# Patient Record
Sex: Male | Born: 1970 | Race: White | Hispanic: No | Marital: Single | Smoking: Current every day smoker
Health system: Southern US, Community
[De-identification: ages and names within clinical notes are randomized; demographics above are authoritative.]

## PROBLEM LIST (undated history)

## (undated) DIAGNOSIS — G43909 Migraine, unspecified, not intractable, without status migrainosus: Secondary | ICD-10-CM

## (undated) HISTORY — DX: Migraine, unspecified, not intractable, without status migrainosus: G43.909

## (undated) HISTORY — PX: FEMUR SURGERY: SHX943

## (undated) HISTORY — PX: HEEL SPUR SURGERY: SHX665

---

## 2000-09-02 DIAGNOSIS — Z8781 Personal history of (healed) traumatic fracture: Secondary | ICD-10-CM

## 2000-09-02 HISTORY — DX: Personal history of (healed) traumatic fracture: Z87.81

## 2011-06-03 ENCOUNTER — Ambulatory Visit: Payer: Self-pay | Admitting: Internal Medicine

## 2012-10-11 IMAGING — CT CT HEAD WITHOUT CONTRAST
1 series · 16 of 30 positions shown, 20 images · non-contrast
Comparison: none

REASON FOR EXAM: headache
COMMENTS:

[Series 2: soft tissue · axial · 0.42mm/px · z∈[+670,+810]mm · 16 of 32 slices shown, 20 images]
[im 2/32  brain]
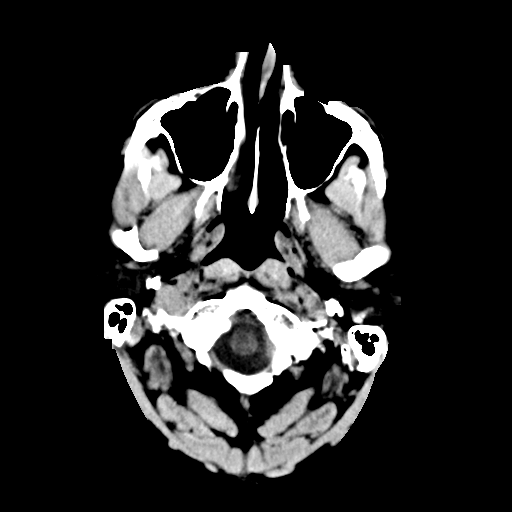
[im 2/32  bone]
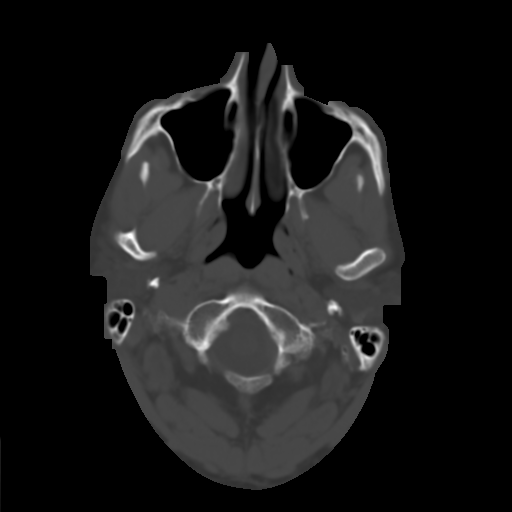
[im 4/32  brain]
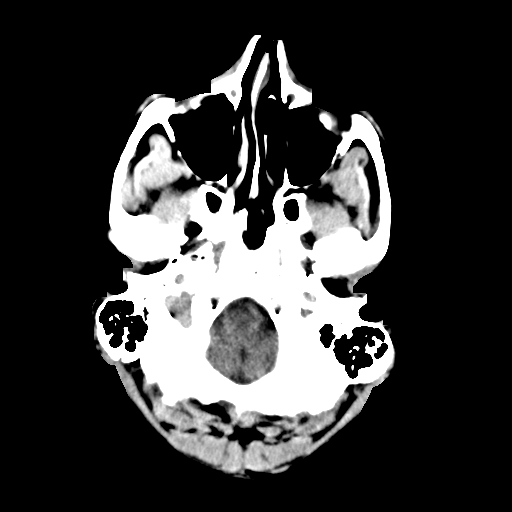
[im 6/32  brain]
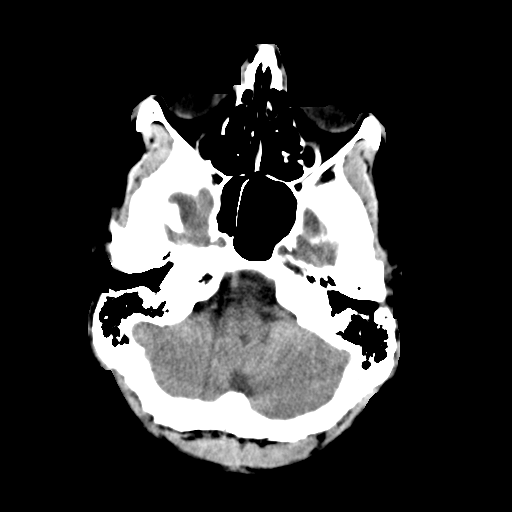
[im 8/32  brain]
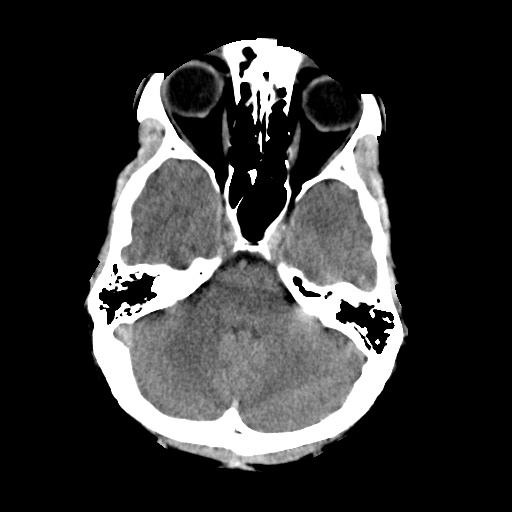
[im 9/32  brain]
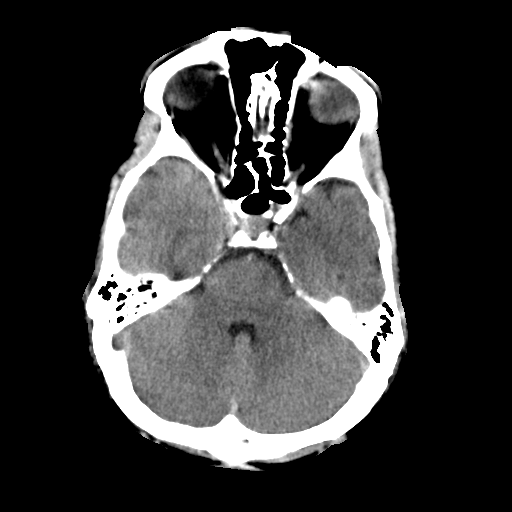
[im 9/32  bone]
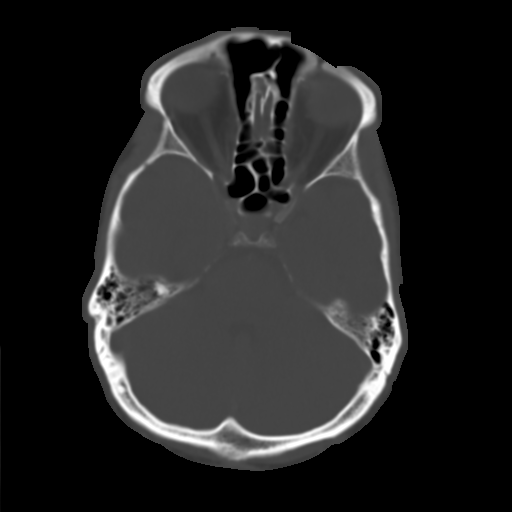
[im 11/32  brain]
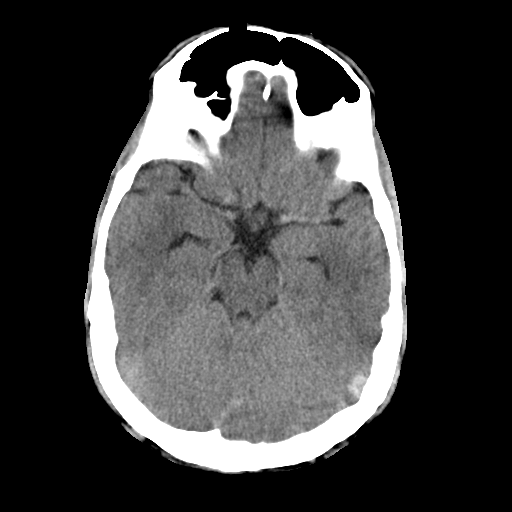
[im 13/32  brain]
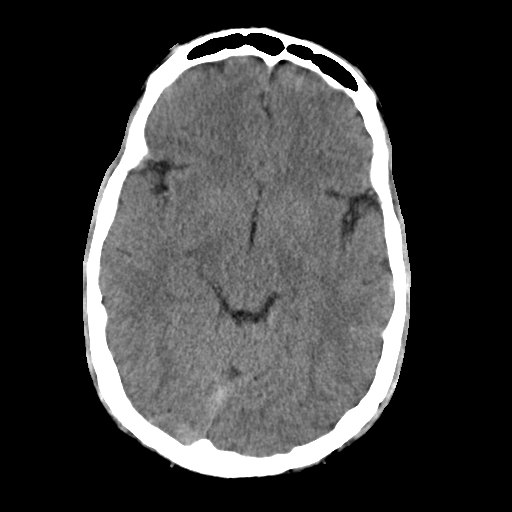
[im 15/32  brain]
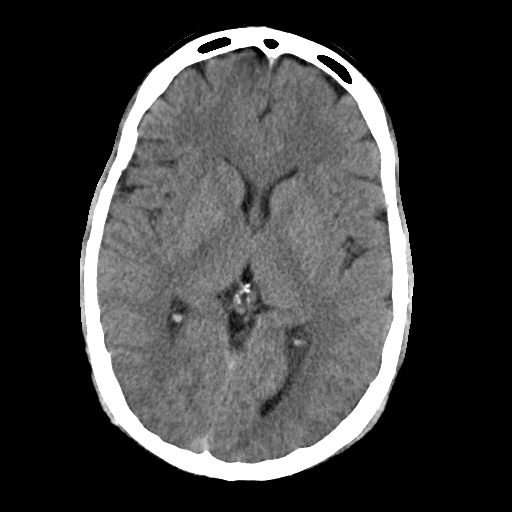
[im 17/32  brain]
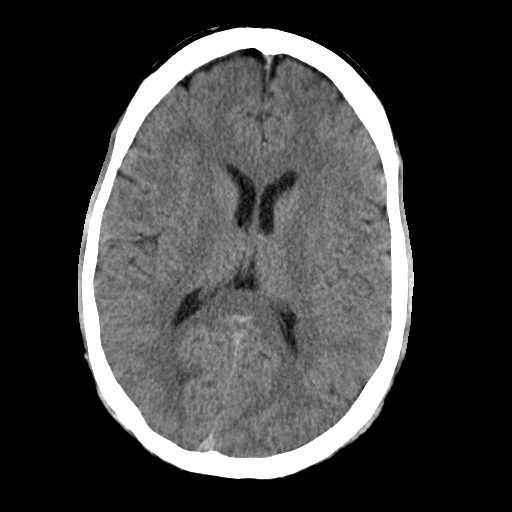
[im 17/32  bone]
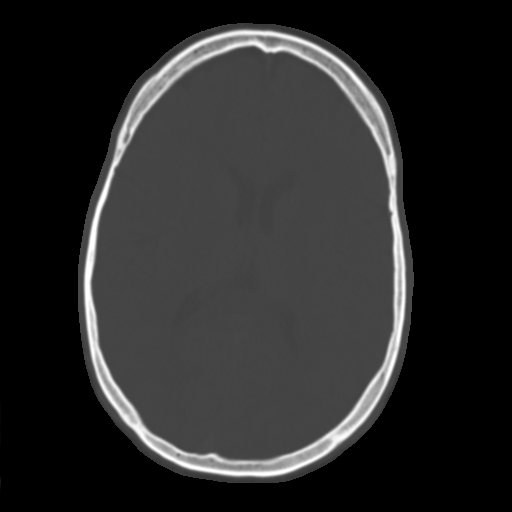
[im 19/32  brain]
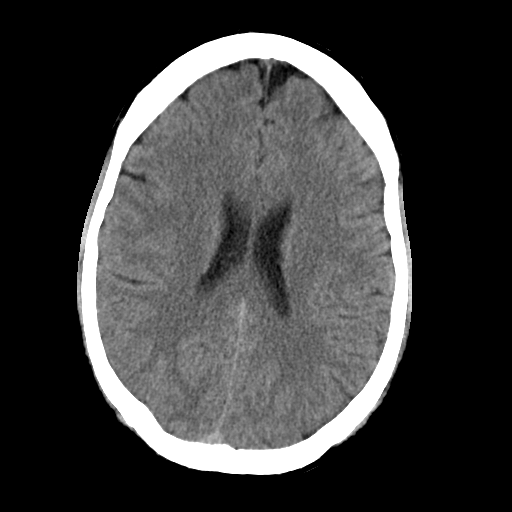
[im 21/32  brain]
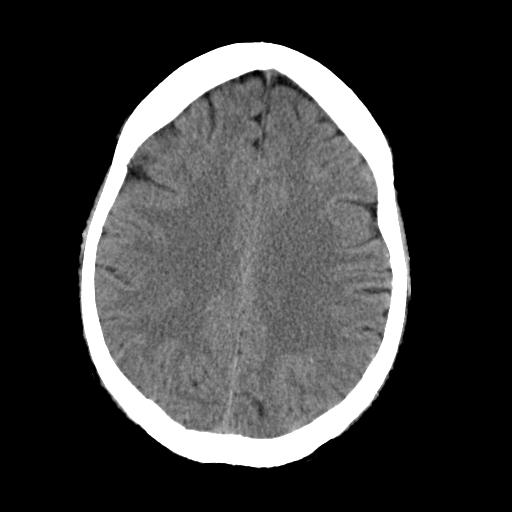
[im 23/32  brain]
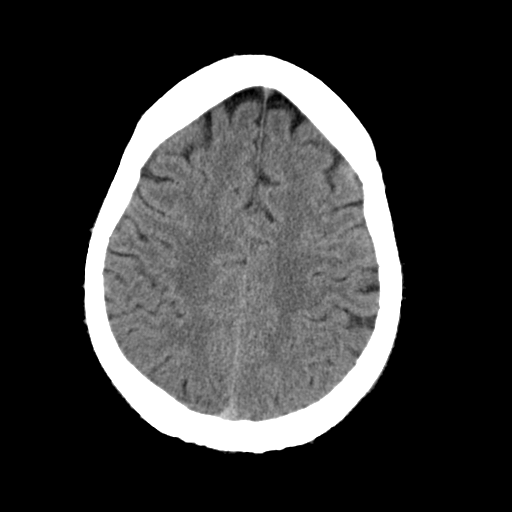
[im 24/32  brain]
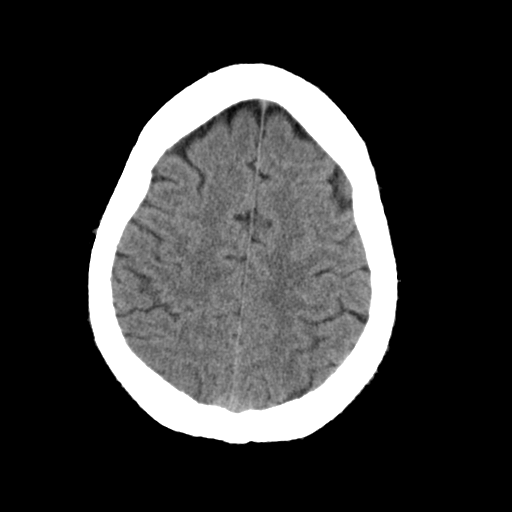
[im 24/32  bone]
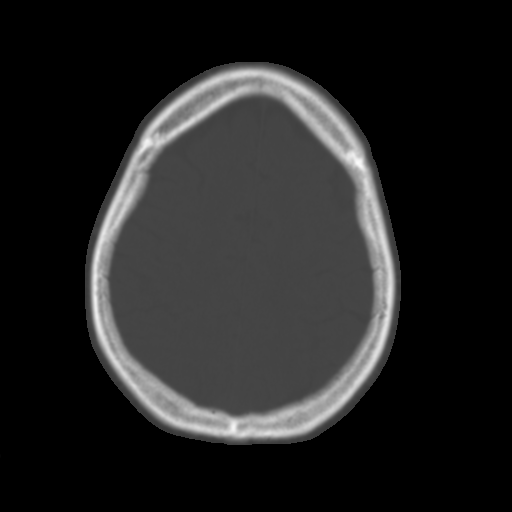
[im 26/32  brain]
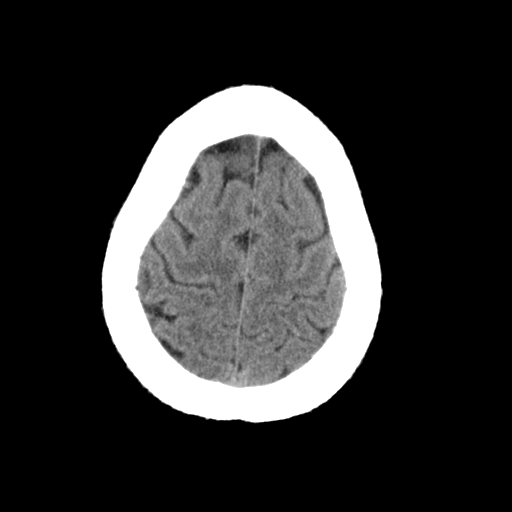
[im 28/32  brain]
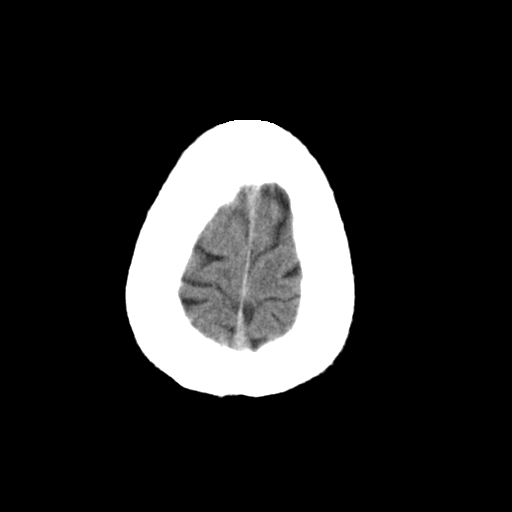
[im 30/32  brain]
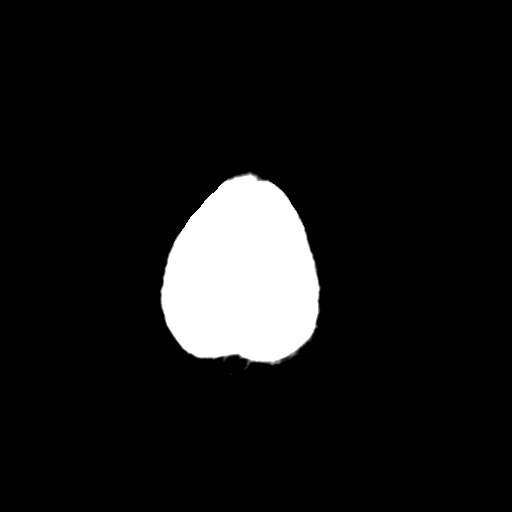

[16 of 30 positions shown; findings below may reference images not displayed]

PROCEDURE:     [facility] - [facility] WITHOUT CONTRAST  - June 03, 2011  [DATE]

RESULT:     Noncontrast CT of the brain is performed in the standard
fashion. The patient has no previous study available for comparison.

The ventricles and sulci are normal. There is no hemorrhage. There is no
focal mass, mass-effect or midline shift. There is no evidence of edema or
territorial infarct. The bone windows demonstrate normal aeration of the
paranasal sinuses and mastoid air cells. There is no skull fracture
demonstrated.
IMPRESSION: 1. No acute intracranial abnormality.

## 2022-08-14 ENCOUNTER — Encounter: Payer: Self-pay | Admitting: Internal Medicine

## 2022-08-14 ENCOUNTER — Other Ambulatory Visit: Payer: Self-pay

## 2022-08-14 ENCOUNTER — Telehealth: Payer: Self-pay

## 2022-08-14 ENCOUNTER — Ambulatory Visit (INDEPENDENT_AMBULATORY_CARE_PROVIDER_SITE_OTHER): Payer: BC Managed Care – PPO | Admitting: Internal Medicine

## 2022-08-14 VITALS — BP 140/80 | HR 94 | Ht 69.5 in | Wt 198.9 lb

## 2022-08-14 DIAGNOSIS — Z1211 Encounter for screening for malignant neoplasm of colon: Secondary | ICD-10-CM

## 2022-08-14 DIAGNOSIS — Z13220 Encounter for screening for lipoid disorders: Secondary | ICD-10-CM

## 2022-08-14 DIAGNOSIS — M1990 Unspecified osteoarthritis, unspecified site: Secondary | ICD-10-CM | POA: Diagnosis not present

## 2022-08-14 DIAGNOSIS — Z125 Encounter for screening for malignant neoplasm of prostate: Secondary | ICD-10-CM

## 2022-08-14 DIAGNOSIS — I10 Essential (primary) hypertension: Secondary | ICD-10-CM | POA: Diagnosis not present

## 2022-08-14 DIAGNOSIS — Z1329 Encounter for screening for other suspected endocrine disorder: Secondary | ICD-10-CM

## 2022-08-14 MED ORDER — NA SULFATE-K SULFATE-MG SULF 17.5-3.13-1.6 GM/177ML PO SOLN: 1.0000 | Freq: Once | ORAL | 0 refills | Status: AC

## 2022-08-14 NOTE — Telephone Encounter (Signed)
Gastroenterology Pre-Procedure Review  Request Date: 09/19/22 Requesting Physician: Dr. Allen Norris  PATIENT REVIEW QUESTIONS: The patient responded to the following health history questions as indicated:    1. Are you having any GI issues? no 2. Do you have a personal history of Polyps? no 3. Do you have a family history of Colon Cancer or Polyps? no 4. Diabetes Mellitus? no 5. Joint replacements in the past 12 months?no 6. Major health problems in the past 3 months?no 7. Any artificial heart valves, MVP, or defibrillator?no    MEDICATIONS & ALLERGIES:    Patient reports the following regarding taking any anticoagulation/antiplatelet therapy:   Plavix, Coumadin, Eliquis, Xarelto, Lovenox, Pradaxa, Brilinta, or Effient? no Aspirin? no  Patient confirms/reports the following medications:  No current outpatient medications on file.   No current facility-administered medications for this visit.    Patient confirms/reports the following allergies:  No Known Allergies  No orders of the defined types were placed in this encounter.   AUTHORIZATION INFORMATION Primary Insurance: 1D#: Group #:  Secondary Insurance: 1D#: Group #:  SCHEDULE INFORMATION: Date: 09/19/22 Time: Location: [facility]

## 2022-09-03 ENCOUNTER — Encounter: Payer: Self-pay | Admitting: Internal Medicine

## 2022-09-03 DIAGNOSIS — I10 Essential (primary) hypertension: Secondary | ICD-10-CM | POA: Insufficient documentation

## 2022-09-03 DIAGNOSIS — Z13220 Encounter for screening for lipoid disorders: Secondary | ICD-10-CM | POA: Insufficient documentation

## 2022-09-03 DIAGNOSIS — M1990 Unspecified osteoarthritis, unspecified site: Secondary | ICD-10-CM | POA: Insufficient documentation

## 2022-09-03 NOTE — Progress Notes (Signed)
Established Patient Office Visit  Subjective:  Patient ID: Eugene Walker, male    DOB: April 18, 1971  Age: 52 y.o. MRN: [id number]  CC:  Chief Complaint  Patient presents with   Establish Care    Wants blood work done, not fasting.    HPI  Eugene Walker presents for establishing care.  Past Medical History:  Diagnosis Date   Migraine     Past Surgical History:  Procedure Laterality Date   FEMUR SURGERY Left    HEEL SPUR SURGERY Left     Family History  Problem Relation Age of Onset   High blood pressure Father     Social History   Socioeconomic History   Marital status: Single    Spouse name: Not on file   Number of children: Not on file   Years of education: Not on file   Highest education level: Not on file  Occupational History   Not on file  Tobacco Use   Smoking status: Every Day    Packs/day: 1.00    Years: 28.00    Total pack years: 28.00    Types: Cigarettes   Smokeless tobacco: Never  Vaping Use   Vaping Use: Never used  Substance and Sexual Activity   Alcohol use: Yes    Alcohol/week: 1.0 standard drink of alcohol    Types: 1 Cans of beer per week   Drug use: Never   Sexual activity: Yes  Other Topics Concern   Not on file  Social History Narrative   Not on file   Social Determinants of Health   Financial Resource Strain: Not on file  Food Insecurity: Not on file  Transportation Needs: Not on file  Physical Activity: Not on file  Stress: Not on file  Social Connections: Not on file  Intimate Partner Violence: Not on file    No current outpatient medications on file.   No Known Allergies  ROS Review of Systems  Constitutional: Negative.   HENT: Negative.    Eyes: Negative.   Respiratory: Negative.    Cardiovascular: Negative.   Gastrointestinal: Negative.   Endocrine: Negative.   Genitourinary: Negative.   Musculoskeletal: Negative.   Skin: Negative.   Allergic/Immunologic: Negative.   Neurological: Negative.    Hematological: Negative.   Psychiatric/Behavioral: Negative.    All other systems reviewed and are negative.     Objective:    Physical Exam Vitals reviewed.  Constitutional:      Appearance: Normal appearance.  HENT:     Mouth/Throat:     Mouth: Mucous membranes are moist.  Eyes:     Pupils: Pupils are equal, round, and reactive to light.  Neck:     Vascular: No carotid bruit.  Cardiovascular:     Rate and Rhythm: Normal rate and regular rhythm.     Pulses: Normal pulses.     Heart sounds: Normal heart sounds.  Pulmonary:     Effort: Pulmonary effort is normal.     Breath sounds: Normal breath sounds.  Abdominal:     General: Bowel sounds are normal.     Palpations: Abdomen is soft. There is no hepatomegaly, splenomegaly or mass.     Tenderness: There is no abdominal tenderness.     Hernia: No hernia is present.  Musculoskeletal:     Cervical back: Neck supple.     Right lower leg: No edema.     Left lower leg: No edema.  Skin:    Findings: No rash.  Neurological:  Mental Status: He is alert and oriented to person, place, and time.     Motor: No weakness.  Psychiatric:        Mood and Affect: Mood normal.        Behavior: Behavior normal.     BP (!) 140/80   Pulse 94   Ht 5' 9.5" (1.765 m)   Wt 198 lb 14.4 oz (90.2 kg)   SpO2 96%   BMI 28.95 kg/m  Wt Readings from Last 3 Encounters:  08/14/22 198 lb 14.4 oz (90.2 kg)     Health Maintenance Due  Topic Date Due   COVID-19 Vaccine (1) Never done   HIV Screening  Never done   Hepatitis C Screening  Never done   COLONOSCOPY (Pts 45-68yr Insurance coverage will need to be confirmed)  Never done    There are no preventive care reminders to display for this patient.  No results found for: "TSH" No results found for: "WBC", "HGB", "HCT", "MCV", "PLT" No results found for: "NA", "K", "CHLORIDE", "CO2", "GLUCOSE", "BUN", "CREATININE", "BILITOT", "ALKPHOS", "AST", "ALT", "PROT", "ALBUMIN", "CALCIUM",  "ANIONGAP", "EGFR", "GFR" No results found for: "CHOL" No results found for: "HDL" No results found for: "LDLCALC" No results found for: "TRIG" No results found for: "CHOLHDL" No results found for: "HGBA1C"    Assessment & Plan:   Problem List Items Addressed This Visit       Cardiovascular and Mediastinum   Hypertension     Patient denies any chest pain or shortness of breath there is no history of palpitation or paroxysmal nocturnal dyspnea   patient was advised to follow low-salt low-cholesterol diet    ideally I want to keep systolic blood pressure below 130 mmHg, patient was asked to check blood pressure one times a week and give me a report on that.  Patient will be follow-up in 3 months  or earlier as needed, patient will call me back for any change in the cardiovascular symptoms Patient was advised to buy a book from local bookstore concerning blood pressure and read several chapters  every day.  This will be supplemented by some of the material we will give him from the office.  Patient should also utilize other resources like YouTube and Internet to learn more about the blood pressure and the diet.      Relevant Orders   CBC with Differential/Platelet   COMPLETE METABOLIC PANEL WITH GFR     Musculoskeletal and Integument   Arthritis    Take vitamin D 1000 units every day      Relevant Orders   Ambulatory referral to Orthopedics     Other   Screening cholesterol level - Primary    Labs ordered      Relevant Orders   Lipid panel    No orders of the defined types were placed in this encounter.   Follow-up: No follow-ups on file.    JCletis Athens MD

## 2022-09-03 NOTE — Assessment & Plan Note (Signed)

## 2022-09-03 NOTE — Assessment & Plan Note (Signed)
Labs ordered.

## 2022-09-03 NOTE — Assessment & Plan Note (Signed)
Take vitamin D 1000 units every day 

## 2022-09-11 ENCOUNTER — Encounter: Payer: Self-pay | Admitting: Gastroenterology

## 2022-09-19 ENCOUNTER — Ambulatory Visit: Payer: BC Managed Care – PPO | Admitting: Anesthesiology

## 2022-09-19 ENCOUNTER — Encounter: Payer: Self-pay | Admitting: Gastroenterology

## 2022-09-19 ENCOUNTER — Ambulatory Visit
Admission: RE | Admit: 2022-09-19 | Discharge: 2022-09-19 | Disposition: A | Discharge status: Home / Self Care | Payer: BC Managed Care – PPO | Source: Ambulatory Visit | Attending: Gastroenterology | Admitting: Gastroenterology

## 2022-09-19 ENCOUNTER — Other Ambulatory Visit: Payer: Self-pay

## 2022-09-19 ENCOUNTER — Encounter
Admission: RE | Disposition: A | Discharge status: Home / Self Care | Payer: Self-pay | Source: Ambulatory Visit | Attending: Gastroenterology

## 2022-09-19 DIAGNOSIS — D128 Benign neoplasm of rectum: Secondary | ICD-10-CM | POA: Diagnosis not present

## 2022-09-19 DIAGNOSIS — K621 Rectal polyp: Secondary | ICD-10-CM

## 2022-09-19 DIAGNOSIS — D124 Benign neoplasm of descending colon: Secondary | ICD-10-CM | POA: Diagnosis not present

## 2022-09-19 DIAGNOSIS — G43909 Migraine, unspecified, not intractable, without status migrainosus: Secondary | ICD-10-CM | POA: Insufficient documentation

## 2022-09-19 DIAGNOSIS — Z1211 Encounter for screening for malignant neoplasm of colon: Secondary | ICD-10-CM

## 2022-09-19 HISTORY — PX: COLONOSCOPY WITH PROPOFOL: SHX5780

## 2022-09-19 HISTORY — PX: POLYPECTOMY: SHX5525

## 2022-09-19 SURGERY — COLONOSCOPY WITH PROPOFOL
Anesthesia: General | Site: Rectum

## 2022-09-19 MED ORDER — STERILE WATER FOR IRRIGATION IR SOLN
Status: DC | PRN
  Administered 2022-09-19: 150 mL

## 2022-09-19 MED ORDER — STERILE WATER FOR IRRIGATION IR SOLN
Status: DC | PRN
  Administered 2022-09-19: 120 mL

## 2022-09-19 MED ORDER — LACTATED RINGERS IV SOLN: INTRAVENOUS | Status: DC

## 2022-09-19 MED ORDER — PROPOFOL 10 MG/ML IV BOLUS
INTRAVENOUS | Status: DC | PRN
  Administered 2022-09-19: 30 mg via INTRAVENOUS
  Administered 2022-09-19: 40 mg via INTRAVENOUS
  Administered 2022-09-19: 30 mg via INTRAVENOUS
  Administered 2022-09-19: 20 mg via INTRAVENOUS
  Administered 2022-09-19: 100 mg via INTRAVENOUS

## 2022-09-19 MED ORDER — SODIUM CHLORIDE 0.9 % IV SOLN: INTRAVENOUS | Status: DC

## 2022-09-19 MED ORDER — LIDOCAINE HCL (CARDIAC) PF 100 MG/5ML IV SOSY
PREFILLED_SYRINGE | INTRAVENOUS | Status: DC | PRN
  Administered 2022-09-19: 60 mg via INTRAVENOUS

## 2022-09-19 SURGICAL SUPPLY — 25 items
CLIP HMST 235XBRD CATH ROT (MISCELLANEOUS) IMPLANT
CLIP RESOLUTION 360 11X235 (MISCELLANEOUS)
ELECT REM PT RETURN 9FT ADLT (ELECTROSURGICAL)
ELECTRODE REM PT RTRN 9FT ADLT (ELECTROSURGICAL) IMPLANT
FCP ESCP3.2XJMB 240X2.8X (MISCELLANEOUS)
FORCEPS BIOP RAD 4 LRG CAP 4 (CUTTING FORCEPS) IMPLANT
FORCEPS BIOP RJ4 240 W/NDL (MISCELLANEOUS)
FORCEPS ESCP3.2XJMB 240X2.8X (MISCELLANEOUS) IMPLANT
GOWN CVR UNV OPN BCK APRN NK (MISCELLANEOUS) ×4 IMPLANT
GOWN ISOL THUMB LOOP REG UNIV (MISCELLANEOUS) ×4
INJECTOR VARIJECT VIN23 (MISCELLANEOUS) IMPLANT
KIT DEFENDO VALVE AND CONN (KITS) IMPLANT
KIT PRC NS LF DISP ENDO (KITS) ×2 IMPLANT
KIT PROCEDURE OLYMPUS (KITS) ×2
MANIFOLD NEPTUNE II (INSTRUMENTS) ×2 IMPLANT
MARKER SPOT ENDO TATTOO 5ML (MISCELLANEOUS) IMPLANT
PROBE APC STR FIRE (PROBE) IMPLANT
RETRIEVER NET ROTH 2.5X230 LF (MISCELLANEOUS) IMPLANT
SNARE COLD EXACTO (MISCELLANEOUS) IMPLANT
SNARE SHORT THROW 13M SML OVAL (MISCELLANEOUS) IMPLANT
SNARE SHORT THROW 30M LRG OVAL (MISCELLANEOUS) IMPLANT
SNARE SNG USE RND 15MM (INSTRUMENTS) IMPLANT
TRAP ETRAP POLY (MISCELLANEOUS) IMPLANT
VARIJECT INJECTOR VIN23 (MISCELLANEOUS)
WATER STERILE IRR 250ML POUR (IV SOLUTION) ×2 IMPLANT

## 2022-09-19 NOTE — Op Note (Signed)
[facility] Gastroenterology Patient Name: Eugene Walker Procedure Date: 09/19/2022 9:05 AM MRN: [id number] Account #: [id number] Date of Birth: [birth year] Admit Type: Outpatient Age: 52 Room: [facility] OR ROOM 01 Gender: Male Note Status: Finalized Instrument Name: [id number] Procedure:             Colonoscopy Indications:           Screening for colorectal malignant neoplasm, This is                         the patient's first colonoscopy Providers:             Lin Landsman MD, MD Medicines:             General Anesthesia Complications:         No immediate complications. Estimated blood loss: None. Procedure:             Pre-Anesthesia Assessment:                        - Prior to the procedure, a History and Physical was                         performed, and patient medications and allergies were                         reviewed. The patient is competent. The risks and                         benefits of the procedure and the sedation options and                         risks were discussed with the patient. All questions                         were answered and informed consent was obtained.                         Patient identification and proposed procedure were                         verified by the physician, the nurse, the                         anesthesiologist, the anesthetist and the technician                         in the pre-procedure area in the procedure room in the                         endoscopy suite. Mental Status Examination: alert and                         oriented. Airway Examination: normal oropharyngeal                         airway and neck mobility. Respiratory Examination:                         clear to auscultation. CV  Examination: normal.                         Prophylactic Antibiotics: The patient does not require                         prophylactic antibiotics. Prior Anticoagulants: The                         patient  has taken no anticoagulant or antiplatelet                         agents. ASA Grade Assessment: II - A patient with mild                         systemic disease. After reviewing the risks and                         benefits, the patient was deemed in satisfactory                         condition to undergo the procedure. The anesthesia                         plan was to use general anesthesia. Immediately prior                         to administration of medications, the patient was                         re-assessed for adequacy to receive sedatives. The                         heart rate, respiratory rate, oxygen saturations,                         blood pressure, adequacy of pulmonary ventilation, and                         response to care were monitored throughout the                         procedure. The physical status of the patient was                         re-assessed after the procedure.                        After obtaining informed consent, the colonoscope was                         passed under direct vision. Throughout the procedure,                         the patient's blood pressure, pulse, and oxygen                         saturations were monitored continuously. The  Colonoscope was introduced through the anus and                         advanced to the the cecum, identified by appendiceal                         orifice and ileocecal valve. The colonoscopy was                         performed without difficulty. The patient tolerated                         the procedure well. The quality of the bowel                         preparation was evaluated using the BBPS [facility] Bowel                         Preparation Scale) with scores of: Right Colon = 3,                         Transverse Colon = 3 and Left Colon = 3 (entire mucosa                         seen well with no residual staining, small fragments                          of stool or opaque liquid). The total BBPS score                         equals 9. The ileocecal valve, appendiceal orifice,                         and rectum were photographed. Findings:      The perianal and digital rectal examinations were normal. Pertinent       negatives include normal sphincter tone and no palpable rectal lesions.      Three sessile polyps were found in the descending colon. The polyps were       6 to 7 mm in size. These polyps were removed with a cold snare.       Resection and retrieval were complete. Estimated blood loss: none.      A 5 mm polyp was found in the rectum. The polyp was sessile. The polyp       was removed with a cold snare. Resection and retrieval were complete.       Estimated blood loss: none.      The retroflexed view of the distal rectum and anal verge was normal and       showed no anal or rectal abnormalities. Impression:            - Three 6 to 7 mm polyps in the descending colon,                         removed with a cold snare. Resected and retrieved.                        - One 5 mm polyp  in the rectum, removed with a cold                         snare. Resected and retrieved.                        - The distal rectum and anal verge are normal on                         retroflexion view. Recommendation:        - Discharge patient to home (with escort).                        - Resume previous diet today.                        - Continue present medications.                        - Await pathology results.                        - Repeat colonoscopy in 3 years for surveillance of                         multiple polyps. Procedure Code(s):     --- Professional ---                        [id number], Colonoscopy, flexible; with removal of                         tumor(s), polyp(s), or other lesion(s) by snare                         technique Diagnosis Code(s):     --- Professional ---                        Z12.11, Encounter for  screening for malignant neoplasm                         of colon                        D12.4, Benign neoplasm of descending colon                        D12.8, Benign neoplasm of rectum CPT copyright 2022 American Medical Association. All rights reserved. The codes documented in this report are preliminary and upon coder review may  be revised to meet current compliance requirements. Dr. Ulyess Mort Lin Landsman MD, MD 09/19/2022 9:39:54 AM This report has been signed electronically. Number of Addenda: 0 Note Initiated On: 09/19/2022 9:05 AM Scope Withdrawal Time: 0 hours 12 minutes 26 seconds  Total Procedure Duration: 0 hours 16 minutes 28 seconds  Estimated Blood Loss:  Estimated blood loss: none.      [facility]

## 2022-09-19 NOTE — H&P (Signed)
  Cephas Darby, MD [address]  [place]  [place], [place] [address]  Main: [phone]  Fax: [phone] Pager: [phone]  Primary Care Physician:  System, Provider Not In Primary Gastroenterologist:  Dr. Cephas Darby  Pre-Procedure History & Physical: HPI:  Eugene Walker is a 52 y.o. male is here for an colonoscopy.   Past Medical History:  Diagnosis Date   History of fractured vertebra 2002   C1-C4   Migraine    approx 2x/wk    Past Surgical History:  Procedure Laterality Date   FEMUR SURGERY Left    HEEL SPUR SURGERY Left     Prior to Admission medications   Medication Sig Start Date End Date Taking? Authorizing Provider  Multiple Vitamin (MULTIVITAMIN) tablet Take 1 tablet by mouth daily.   Yes [provider]    Allergies as of 08/14/2022   (No Known Allergies)    Family History  Problem Relation Age of Onset   High blood pressure Father     Social History   Socioeconomic History   Marital status: Single    Spouse name: Not on file   Number of children: Not on file   Years of education: Not on file   Highest education level: Not on file  Occupational History   Not on file  Tobacco Use   Smoking status: Every Day    Packs/day: 1.00    Years: 30.00    Total pack years: 30.00    Types: Cigarettes   Smokeless tobacco: Never   Tobacco comments:    Started smoking around age 82  Vaping Use   Vaping Use: Never used  Substance and Sexual Activity   Alcohol use: Yes    Alcohol/week: 1.0 standard drink of alcohol    Types: 1 Cans of beer per week    Comment: rare   Drug use: Never   Sexual activity: Yes  Other Topics Concern   Not on file  Social History Narrative   Not on file   Social Determinants of Health   Financial Resource Strain: Not on file  Food Insecurity: Not on file  Transportation Needs: Not on file  Physical Activity: Not on file  Stress: Not on file  Social Connections: Not on file  Intimate Partner  Violence: Not on file    Review of Systems: See HPI, otherwise negative ROS  Physical Exam: BP (!) 144/87   Pulse (!) 106   Temp 97.7 F (36.5 C) (Temporal)   Resp 16   Ht 5' 9.5" (1.765 m)   Wt 85.3 kg   SpO2 96%   BMI 27.36 kg/m  General:   Alert,  pleasant and cooperative in NAD Head:  Normocephalic and atraumatic. Neck:  Supple; no masses or thyromegaly. Lungs:  Clear throughout to auscultation.    Heart:  Regular rate and rhythm. Abdomen:  Soft, nontender and nondistended. Normal bowel sounds, without guarding, and without rebound.   Neurologic:  Alert and  oriented x4;  grossly normal neurologically.  Impression/Plan: Eugene Walker is here for an colonoscopy to be performed for colon cancer screening  Risks, benefits, limitations, and alternatives regarding  colonoscopy have been reviewed with the patient.  Questions have been answered.  All parties agreeable.   Sherri Sear, MD  09/19/2022, 8:26 AM

## 2022-09-19 NOTE — Anesthesia Postprocedure Evaluation (Signed)
Anesthesia Post Note  Patient: Eugene Walker  Procedure(s) Performed: COLONOSCOPY WITH PROPOFOL (Rectum) POLYPECTOMY (Rectum)  Patient location during evaluation: PACU Anesthesia Type: General Level of consciousness: awake and alert Pain management: pain level controlled Vital Signs Assessment: post-procedure vital signs reviewed and stable Respiratory status: spontaneous breathing, nonlabored ventilation, respiratory function stable and patient connected to nasal cannula oxygen Cardiovascular status: blood pressure returned to baseline and stable Postop Assessment: no apparent nausea or vomiting Anesthetic complications: no   No notable events documented.   Last Vitals:  Vitals:   09/19/22 0942 09/19/22 0954  BP: 101/68 120/86  Pulse:  90  Resp:  19  Temp: (!) 36.4 C (!) 36.4 C  SpO2:  97%    Last Pain:  Vitals:   09/19/22 0954  TempSrc:   PainSc: 0-No pain                 Arita Miss

## 2022-09-19 NOTE — Transfer of Care (Signed)
Immediate Anesthesia Transfer of Care Note  Patient: Eugene Walker  Procedure(s) Performed: COLONOSCOPY WITH PROPOFOL (Rectum) POLYPECTOMY (Rectum)  Patient Location: PACU  Anesthesia Type: General  Level of Consciousness: awake, alert  and patient cooperative  Airway and Oxygen Therapy: Patient Spontanous Breathing and Patient connected to supplemental oxygen  Post-op Assessment: Post-op Vital signs reviewed, Patient's Cardiovascular Status Stable, Respiratory Function Stable, Patent Airway and No signs of Nausea or vomiting  Post-op Vital Signs: Reviewed and stable  Complications: No notable events documented.
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
## 2022-09-20 ENCOUNTER — Encounter: Payer: Self-pay | Admitting: Gastroenterology

## 2022-09-23 LAB — SURGICAL PATHOLOGY

## 2022-09-27 ENCOUNTER — Encounter: Payer: Self-pay | Admitting: Gastroenterology
